# Patient Record
Sex: Male | Born: 1950 | Race: White | Hispanic: No | Marital: Single | Smoking: Never smoker
Health system: Southern US, Community
[De-identification: ages and names within clinical notes are randomized; demographics above are authoritative.]

## PROBLEM LIST (undated history)

## (undated) DIAGNOSIS — I4891 Unspecified atrial fibrillation: Secondary | ICD-10-CM

## (undated) DIAGNOSIS — M1990 Unspecified osteoarthritis, unspecified site: Secondary | ICD-10-CM

## (undated) DIAGNOSIS — I10 Essential (primary) hypertension: Secondary | ICD-10-CM

## (undated) DIAGNOSIS — I359 Nonrheumatic aortic valve disorder, unspecified: Secondary | ICD-10-CM

## (undated) HISTORY — PX: AORTIC VALVE REPLACEMENT: SHX41

---

## 2011-07-24 ENCOUNTER — Other Ambulatory Visit: Payer: Self-pay

## 2011-07-24 ENCOUNTER — Emergency Department (HOSPITAL_COMMUNITY)
Admission: EM | Admit: 2011-07-24 | Discharge: 2011-07-24 | Disposition: A | Discharge status: Home / Self Care | Payer: Self-pay | Attending: Emergency Medicine | Admitting: Emergency Medicine

## 2011-07-24 DIAGNOSIS — T50901A Poisoning by unspecified drugs, medicaments and biological substances, accidental (unintentional), initial encounter: Secondary | ICD-10-CM

## 2011-07-24 DIAGNOSIS — T6591XA Toxic effect of unspecified substance, accidental (unintentional), initial encounter: Secondary | ICD-10-CM | POA: Insufficient documentation

## 2011-07-24 HISTORY — DX: Unspecified osteoarthritis, unspecified site: M19.90

## 2011-07-24 HISTORY — DX: Nonrheumatic aortic valve disorder, unspecified: I35.9

## 2011-07-24 HISTORY — DX: Essential (primary) hypertension: I10

## 2011-07-24 NOTE — ED Provider Notes (Addendum)
History     CSN: [id number] Arrival date & time: 07/24/2011  8:26 AM   First MD Initiated Contact with Patient 07/24/11 0840      Chief Complaint  Patient presents with  . Drug Overdose    (Consider location/radiation/quality/duration/timing/severity/associated sxs/prior treatment) Patient is a 60 y.o. male presenting with Overdose.  Drug Overdose This is a new problem. The current episode started 1 to 2 hours ago. The problem has not changed since onset.Pertinent negatives include no chest pain, no headaches and no shortness of breath. The symptoms are aggravated by nothing. The symptoms are relieved by nothing. He has tried nothing for the symptoms.   At home 730 hours this morning, the patient realizesdthat he may have taken his usual morning medications twice. He uses a daily medication container set up each week. His Wednesday and Thursday morning medications were missing from the container this morning. He cannot recall taking them twice but thinks that he may have. He is completely asymptomatic at this time. He has not eaten breakfast yet.  Past Medical History  Diagnosis Date  . Hypertension   . Aortic valve disease   . Arthritis     Past Surgical History  Procedure Date  . Aortic valve replacement     History reviewed. No pertinent family history.  History  Substance Use Topics  . Smoking status: Never Smoker   . Smokeless tobacco: Never Used  . Alcohol Use: No      Review of Systems  Respiratory: Negative for shortness of breath.   Cardiovascular: Negative for chest pain.  Neurological: Negative for headaches.  All other systems reviewed and are negative.    Allergies  Review of patient's allergies indicates no known allergies.  Home Medications   Current Outpatient Rx  Name Route Sig Dispense Refill  . ACETAMINOPHEN 500 MG PO TABS Oral Take 1,000 mg by mouth every 6 (six) hours as needed. For pain     . ASPIRIN EC 81 MG PO TBEC Oral Take 81 mg by  mouth daily. May have taken twice this am...07/24/11     . HYDROCHLOROTHIAZIDE 12.5 MG PO CAPS Oral Take 12.5 mg by mouth daily.      Marland Kitchen LISINOPRIL 20 MG PO TABS Oral Take 20 mg by mouth daily. May have taken twice today 07/24/11     . POTASSIUM CHLORIDE 20 MEQ PO PACK Oral Take 20 mEq by mouth 2 (two) times daily.      Marland Kitchen SOTALOL HCL 80 MG PO TABS Oral Take 160 mg by mouth 2 (two) times daily. May have taken twice this am 07/24/11 at 7:20am     . TETRAHYDROZOLINE HCL 0.05 % OP SOLN Both Eyes Place 1 drop into both eyes 2 (two) times daily. For allergy eyes     . WARFARIN SODIUM 2.5 MG PO TABS Oral Take 2.5 mg by mouth daily. Takes tues, thurs., sat.     . WARFARIN SODIUM 5 MG PO TABS Oral Take 5 mg by mouth daily. 07/24/11 may have taken warfarin twice this am..pt. Not sure.  Takes 82m mon., wed., fri., sun.       BP 153/101  Pulse 60  Temp(Src) 97.4 F (36.3 C) (Oral)  Resp 20  SpO2 100%  Physical Exam  Constitutional: He is oriented to person, place, and time. He appears well-developed and well-nourished.  HENT:  Head: Normocephalic and atraumatic.  Eyes: Conjunctivae are normal. Pupils are equal, round, and reactive to light.  Neck: Normal range of motion.  Neck supple.  Cardiovascular: Normal rate.   Pulmonary/Chest: Effort normal and breath sounds normal.  Musculoskeletal: Normal range of motion. He exhibits no edema and no tenderness.  Neurological: He is alert and oriented to person, place, and time. No cranial nerve deficit. He exhibits normal muscle tone. Coordination normal.  Skin: Skin is warm and dry.  Psychiatric: He has a normal mood and affect. His behavior is normal. Judgment and thought content normal.    ED Course  Procedures (including critical care time)  Date: 07/24/2011  Rate: 64  Rhythm: normal sinus rhythm  QRS Axis: normal  Intervals: normal  ST/T Wave abnormalities: normal  Conduction Disutrbances:none  Narrative Interpretation: PVC present  Old EKG  Reviewed: none available      9:54 AM Repeat blood pressure 140/90. Patient alert, cooperative, comfortable at this time. He has spoken with his cardiologist about the events, this morning.     Labs Reviewed - No data to display No results found.   No diagnosis found.    MDM  Medication misadventure, without toxicity. Patient is stable for discharge. He can resume his normal treatments tomorrow.        Flint Melter, MD 07/24/11 1610  Flint Melter, MD 07/24/11 [phone]

## 2011-07-24 NOTE — ED Notes (Signed)
Pt presents from home this morning. He states that while he was getting ready this morning he took his regular medications which included blood pressure medications and coumadin. He states that he thinks that he may have taken 2 doses of lisinopril, metoprolol, and coumadin. Pt has no complaints. Alert and oriented. This was in no way a suicidal attempt. Family at the bedside.

## 2011-07-24 NOTE — ED Notes (Signed)
While patient was getting ready this morning for work he was rushing and may have taken an extra dose of blood pressure medication and blood thinner. He denies any suicidal ideation or attempt. No distress. Breath sounds are clear and bowel sounds are present.

## 2011-07-24 NOTE — ED Notes (Signed)
Effie Shy, MD at bedside speaking with family about d/c home

## 2019-07-20 DIAGNOSIS — Z01818 Encounter for other preprocedural examination: Secondary | ICD-10-CM

## 2019-08-29 ENCOUNTER — Emergency Department (HOSPITAL_BASED_OUTPATIENT_CLINIC_OR_DEPARTMENT_OTHER): Payer: Medicaid Other

## 2019-08-29 ENCOUNTER — Encounter (HOSPITAL_BASED_OUTPATIENT_CLINIC_OR_DEPARTMENT_OTHER): Payer: Self-pay | Admitting: Emergency Medicine

## 2019-08-29 ENCOUNTER — Emergency Department (HOSPITAL_BASED_OUTPATIENT_CLINIC_OR_DEPARTMENT_OTHER)
Admission: EM | Admit: 2019-08-29 | Discharge: 2019-08-29 | Disposition: A | Discharge status: Home / Self Care | Payer: Medicaid Other | Attending: Emergency Medicine | Admitting: Emergency Medicine

## 2019-08-29 ENCOUNTER — Other Ambulatory Visit: Payer: Self-pay

## 2019-08-29 DIAGNOSIS — I50810 Right heart failure, unspecified: Secondary | ICD-10-CM

## 2019-08-29 DIAGNOSIS — I10 Essential (primary) hypertension: Secondary | ICD-10-CM | POA: Diagnosis not present

## 2019-08-29 DIAGNOSIS — I4891 Unspecified atrial fibrillation: Secondary | ICD-10-CM | POA: Insufficient documentation

## 2019-08-29 DIAGNOSIS — R0602 Shortness of breath: Secondary | ICD-10-CM | POA: Diagnosis present

## 2019-08-29 DIAGNOSIS — Z7982 Long term (current) use of aspirin: Secondary | ICD-10-CM | POA: Insufficient documentation

## 2019-08-29 DIAGNOSIS — Z20828 Contact with and (suspected) exposure to other viral communicable diseases: Secondary | ICD-10-CM | POA: Insufficient documentation

## 2019-08-29 DIAGNOSIS — Z7901 Long term (current) use of anticoagulants: Secondary | ICD-10-CM | POA: Diagnosis not present

## 2019-08-29 DIAGNOSIS — Z79899 Other long term (current) drug therapy: Secondary | ICD-10-CM | POA: Insufficient documentation

## 2019-08-29 HISTORY — DX: Unspecified atrial fibrillation: I48.91

## 2019-08-29 LAB — CBC WITH DIFFERENTIAL/PLATELET
Abs Immature Granulocytes: 0.12 10*3/uL — ABNORMAL HIGH (ref 0.00–0.07)
Basophils Absolute: 0 10*3/uL (ref 0.0–0.1)
Basophils Relative: 0 %
Eosinophils Absolute: 0 10*3/uL (ref 0.0–0.5)
Eosinophils Relative: 0 %
HCT: 31.8 % — ABNORMAL LOW (ref 39.0–52.0)
Hemoglobin: 9.6 g/dL — ABNORMAL LOW (ref 13.0–17.0)
Immature Granulocytes: 1 %
Lymphocytes Relative: 3 %
Lymphs Abs: 0.3 10*3/uL — ABNORMAL LOW (ref 0.7–4.0)
MCH: 25.5 pg — ABNORMAL LOW (ref 26.0–34.0)
MCHC: 30.2 g/dL (ref 30.0–36.0)
MCV: 84.4 fL (ref 80.0–100.0)
Monocytes Absolute: 0.6 10*3/uL (ref 0.1–1.0)
Monocytes Relative: 6 %
Neutro Abs: 8.8 10*3/uL — ABNORMAL HIGH (ref 1.7–7.7)
Neutrophils Relative %: 90 %
Platelets: 400 10*3/uL (ref 150–400)
RBC: 3.77 MIL/uL — ABNORMAL LOW (ref 4.22–5.81)
RDW: 19.6 % — ABNORMAL HIGH (ref 11.5–15.5)
WBC: 9.9 10*3/uL (ref 4.0–10.5)
nRBC: 0 % (ref 0.0–0.2)

## 2019-08-29 LAB — COMPREHENSIVE METABOLIC PANEL
ALT: 19 U/L (ref 0–44)
AST: 20 U/L (ref 15–41)
Albumin: 3.6 g/dL (ref 3.5–5.0)
Alkaline Phosphatase: 56 U/L (ref 38–126)
Anion gap: 10 (ref 5–15)
BUN: 13 mg/dL (ref 8–23)
CO2: 25 mmol/L (ref 22–32)
Calcium: 9 mg/dL (ref 8.9–10.3)
Chloride: 100 mmol/L (ref 98–111)
Creatinine, Ser: 0.71 mg/dL (ref 0.61–1.24)
GFR calc Af Amer: >60 mL/min (ref >60)
GFR calc non Af Amer: >60 mL/min (ref >60)
Glucose, Bld: 107 mg/dL — ABNORMAL HIGH (ref 70–99)
Potassium: 3.4 mmol/L — ABNORMAL LOW (ref 3.5–5.1)
Sodium: 135 mmol/L (ref 135–145)
Total Bilirubin: 1.5 mg/dL — ABNORMAL HIGH (ref 0.3–1.2)
Total Protein: 6.9 g/dL (ref 6.5–8.1)

## 2019-08-29 LAB — SARS CORONAVIRUS 2 AG (30 MIN TAT): SARS Coronavirus 2 Ag: NEGATIVE

## 2019-08-29 LAB — TROPONIN I (HIGH SENSITIVITY)
Troponin I (High Sensitivity): 20 ng/L — ABNORMAL HIGH (ref <18)
Troponin I (High Sensitivity): 18 ng/L — ABNORMAL HIGH (ref <18)

## 2019-08-29 LAB — BRAIN NATRIURETIC PEPTIDE: B Natriuretic Peptide: 389.7 pg/mL — ABNORMAL HIGH (ref 0.0–100.0)

## 2019-08-29 MED ORDER — MORPHINE SULFATE (PF) 4 MG/ML IV SOLN
4.0000 mg | Freq: Once | INTRAVENOUS | Status: AC
  Administered 2019-08-29: 4 mg via INTRAVENOUS
  Filled 2019-08-29: qty 1

## 2019-08-29 MED ORDER — IOHEXOL 350 MG/ML SOLN
100.0000 mL | Freq: Once | INTRAVENOUS | Status: AC | PRN
  Administered 2019-08-29: 94 mL via INTRAVENOUS

## 2019-08-29 MED ORDER — ONDANSETRON HCL 4 MG/2ML IJ SOLN
4.0000 mg | Freq: Once | INTRAMUSCULAR | Status: AC
  Administered 2019-08-29: 14:00:00 4 mg via INTRAVENOUS
  Filled 2019-08-29: qty 2

## 2019-08-29 NOTE — ED Notes (Signed)
ED Provider at bedside. 

## 2019-08-29 NOTE — ED Notes (Signed)
Pt given urinal.

## 2019-08-29 NOTE — ED Notes (Signed)
Spoke with Maggie in lab to add on BNP 

## 2019-08-29 NOTE — ED Provider Notes (Signed)
[facility] EMERGENCY DEPARTMENT Provider Note   CSN: [id number] Arrival date & time: 08/29/19  1058     History Chief Complaint  Patient presents with   Shortness of Breath    Craig Kirk is a 68 y.o. male.  68 yo M with a chief complaints of shortness of breath.  Has been going on for a few days.  He is also had nausea and loss of smell.  He had his left knee replaced 12 days ago.  Has had some swelling and pain to the area.  Was seen a couple days ago and had an ultrasound that was negative for DVT.  He is concerned that maybe he has an infection to the knee.  Denies any purulent drainage to the area.  Had some mild bleeding and changed his dressing at home.  He denies chest pain or pressure.  Denies cough or congestion.  No fevers.  No abdominal pain.  The history is provided by the patient.  Shortness of Breath Severity:  Moderate Onset quality:  Gradual Duration:  2 days Timing:  Constant Progression:  Unchanged Chronicity:  New Relieved by:  Nothing Worsened by:  Nothing Ineffective treatments:  None tried Associated symptoms: no abdominal pain, no chest pain, no fever, no headaches, no rash and no vomiting        Past Medical History:  Diagnosis Date   Aortic valve disease    Arthritis    Atrial fibrillation (HCC)    Hypertension     There are no problems to display for this patient.   Past Surgical History:  Procedure Laterality Date   AORTIC VALVE REPLACEMENT         No family history on file.  Social History   Tobacco Use   Smoking status: Never Smoker   Smokeless tobacco: Never Used  Substance Use Topics   Alcohol use: No   Drug use: No    Home Medications Prior to Admission medications   Medication Sig Start Date End Date Taking? Authorizing Provider  acetaminophen (TYLENOL) 500 MG tablet Take 1,000 mg by mouth every 6 (six) hours as needed. For pain     [provider]  aspirin EC 81 MG tablet Take 81 mg  by mouth daily. May have taken twice this am...07/24/11     [provider]  diltiazem (CARDIZEM CD) 180 MG 24 hr capsule Take 180 mg by mouth daily. 05/26/19   [provider]  hydrochlorothiazide (MICROZIDE) 12.5 MG capsule Take 12.5 mg by mouth daily.      [provider]  lisinopril (PRINIVIL,ZESTRIL) 20 MG tablet Take 20 mg by mouth daily. May have taken twice today 07/24/11     [provider]  potassium chloride (KLOR-CON) 20 MEQ packet Take 20 mEq by mouth 2 (two) times daily.      [provider]  pravastatin (PRAVACHOL) 40 MG tablet Take 40 mg by mouth daily. 08/06/19   [provider]  rizatriptan (MAXALT) 10 MG tablet Take 10 mg by mouth 2 (two) times daily as needed. 08/16/19   [provider]  sotalol (BETAPACE) 80 MG tablet Take 160 mg by mouth 2 (two) times daily. May have taken twice this am 07/24/11 at 7:20am     [provider]  tetrahydrozoline 0.05 % ophthalmic solution Place 1 drop into both eyes 2 (two) times daily. For allergy eyes     [provider]  warfarin (COUMADIN) 2.5 MG tablet Take 2.5 mg by mouth  daily. Takes tues, thurs., sat.     [provider]  warfarin (COUMADIN) 5 MG tablet Take 5 mg by mouth daily. 07/24/11 may have taken warfarin twice this am..pt. Not sure.  Takes 36m mon., wed., fri., sun.     [provider]    Allergies    Patient has no known allergies.  Review of Systems   Review of Systems  Constitutional: Negative for chills and fever.  HENT: Negative for congestion and facial swelling.   Eyes: Negative for discharge and visual disturbance.  Respiratory: Positive for shortness of breath.   Cardiovascular: Positive for leg swelling. Negative for chest pain and palpitations.  Gastrointestinal: Positive for nausea. Negative for abdominal pain, diarrhea and vomiting.  Musculoskeletal: Negative for arthralgias and myalgias.  Skin: Negative for color  change and rash.  Neurological: Negative for tremors, syncope and headaches.  Psychiatric/Behavioral: Negative for confusion and dysphoric mood.    Physical Exam Updated Vital Signs BP (!) 164/87 (BP Location: Left Arm)    Pulse 80    Temp 98.4 F (36.9 C) (Oral)    Resp 18    Ht  (1.803 m)    Wt 90.7 kg    SpO2 99%    BMI 27.89 kg/m   Physical Exam Vitals and nursing note reviewed.  Constitutional:      Appearance: He is well-developed.  HENT:     Head: Normocephalic and atraumatic.  Eyes:     Pupils: Pupils are equal, round, and reactive to light.  Neck:     Vascular: No JVD.  Cardiovascular:     Rate and Rhythm: Normal rate and regular rhythm.     Heart sounds: No murmur. No friction rub. No gallop.   Pulmonary:     Effort: No respiratory distress.     Breath sounds: No decreased breath sounds or wheezing.  Abdominal:     General: There is no distension.     Tenderness: There is no guarding or rebound.  Musculoskeletal:        General: Normal range of motion.     Cervical back: Normal range of motion and neck supple.     Left lower leg: Edema present.     Comments: Left leg edema.  No erythema.  Mildly more warm than the other side.  Pulse motor and sensation intact distally.  Skin:    Coloration: Skin is not pale.     Findings: No rash.  Neurological:     Mental Status: He is alert and oriented to person, place, and time.  Psychiatric:        Behavior: Behavior normal.     ED Results / Procedures / Treatments   Labs (all labs ordered are listed, but only abnormal results are displayed) Labs Reviewed  CBC WITH DIFFERENTIAL/PLATELET - Abnormal; Notable for the following components:      Result Value   RBC 3.77 (*)    Hemoglobin 9.6 (*)    HCT 31.8 (*)    MCH 25.5 (*)    RDW 19.6 (*)    Neutro Abs 8.8 (*)    Lymphs Abs 0.3 (*)    Abs Immature Granulocytes 0.12 (*)    All other components within normal limits  COMPREHENSIVE METABOLIC PANEL - Abnormal;  Notable for the following components:   Potassium 3.4 (*)    Glucose, Bld 107 (*)    Total Bilirubin 1.5 (*)    All other components within normal limits  BRAIN NATRIURETIC PEPTIDE -  Abnormal; Notable for the following components:   B Natriuretic Peptide 389.7 (*)    All other components within normal limits  TROPONIN I (HIGH SENSITIVITY) - Abnormal; Notable for the following components:   Troponin I (High Sensitivity) 20 (*)    All other components within normal limits  TROPONIN I (HIGH SENSITIVITY) - Abnormal; Notable for the following components:   Troponin I (High Sensitivity) 18 (*)    All other components within normal limits  SARS CORONAVIRUS 2 AG (30 MIN TAT)  CULTURE, BLOOD (ROUTINE X 2)  CULTURE, BLOOD (ROUTINE X 2)  NOVEL CORONAVIRUS, NAA (HOSP ORDER, SEND-OUT TO REF LAB; TAT 18-24 HRS)    EKG EKG Interpretation  Date/Time:  Sunday August 29 2019 11:18:18 EST Ventricular Rate:  83 PR Interval:    QRS Duration: 145 QT Interval:  417 QTC Calculation: 490 R Axis:   -16 Text Interpretation: sinus arrythmia IVCD, consider atypical LBBB No significant change since last tracing Confirmed by Melene Plan [phone]) on 08/29/2019 11:35:10 AM   Radiology CT Angio Chest PE W and/or Wo Contrast  Result Date: 08/29/2019 CLINICAL DATA:  68 year old with acute onset of shortness of breath upon exertion and anosmia. Negative rapid COVID-19 test today. LEFT total knee arthroplasty 08/17/2019. Surgical history includes aortic valve replacement. EXAM: CT ANGIOGRAPHY CHEST WITH CONTRAST TECHNIQUE: Multidetector CT imaging of the chest was performed using the standard protocol during bolus administration of intravenous contrast. Multiplanar CT image reconstructions and MIPs were obtained to evaluate the vascular anatomy. CONTRAST:  63mL OMNIPAQUE IOHEXOL 350 MG/ML IV. COMPARISON:  None. FINDINGS: Cardiovascular: Contrast opacification of pulmonary arteries is very good. No filling defects  within either main pulmonary artery or their segmental branches in either lung to suggest pulmonary embolism. Heart moderately enlarged with LEFT ventricular enlargement and LEFT atrial enlargement. Prior aortic valve replacement. Moderate LAD and LEFT circumflex coronary atherosclerosis. Reflux of contrast into the intrahepatic IVC and the hepatic veins. Ascending thoracic aortic aneurysm measuring up to 5.0 cm diameter. Moderate atherosclerosis involving the thoracic and proximal abdominal aorta. Atherosclerosis involving the proximal great vessels without evidence of significant stenosis. Bovine aortic arch anatomy (LEFT common carotid artery arises from the innominate artery). Mediastinum/Nodes: Numerous upper normal sized mediastinal and BILATERAL hilar lymph nodes, the largest in the RIGHT hilum measuring approximately 2.4 x 1.8 cm. No nodal masses. Normal appearing esophagus. Visualized thyroid gland normal in appearance. Lungs/Pleura: Ground-glass opacities throughout both lungs with geographic areas of hyperlucency scattered throughout both lungs. Scattered blebs or air cysts in both lungs. No confluent airspace consolidation. Prominent subpleural fat involving both posteroinferior hemithoraces and involving the LEFT major fissure. No pleural effusions. Central airways patent with moderate central peribronchial thickening. Upper Abdomen: Visualized upper abdomen unremarkable for the early arterial phase of enhancement. Musculoskeletal: Degenerative disc disease and spondylosis involving the visualized lower cervical spine and throughout the thoracic spine. Prior sternotomy. No acute findings. Review of the MIP images confirms the above findings. IMPRESSION: 1. No evidence of pulmonary embolism. 2. Cardiomegaly with LEFT ventricular enlargement and LEFT atrial enlargement. Reflux of contrast into the intrahepatic IVC and the hepatic veins may indicate right heart failure and/or tricuspid regurgitation. 3.  Ground-glass opacities throughout both lungs with geographic areas of hyperlucency scattered throughout both lungs, likely indicating indicating small airways disease (asthma and/or bronchitis). However, ground-glass opacities in both lungs is a commonly reported imaging feature of COVID-19 pneumonia. Other processes such as influenza pneumonia and organizing pneumonia, as can be seen with drug toxicity and connective  tissue disease, can cause a similar imaging pattern. 4. Numerous upper normal-sized mediastinal and BILATERAL hilar lymph nodes, likely reactive. 5. Ascending thoracic aortic aneurysm measuring up to 5.0 cm diameter. Recommend semi-annual imaging followup by CTA or MRA and referral to cardiothoracic surgery if not already obtained. This recommendation follows 2010 ACCF/AHA/AATS/ACR/ASA/SCA/SCAI/SIR/STS/SVM Guidelines for the Diagnosis and Management of Patients With Thoracic Aortic Disease. Circulation. 2010; 121: P295-J884. Aortic aneurysm NOS (ICD10-I71.9) Aortic Atherosclerosis (ICD10-I70.0). Emphysema (ICD10-J43.9). Electronically Signed   By: Evangeline Dakin M.D.   On: 08/29/2019 14:25   DG Chest Port 1 View  Result Date: 08/29/2019 CLINICAL DATA:  Shortness of breath. Loss of smell. EXAM: PORTABLE CHEST 1 VIEW COMPARISON:  July 20, 2019 FINDINGS: The mediastinal contour is stable. The heart size is normal. Mild increased pulmonary interstitium is identified bilaterally. There is probable minimal left pleural effusion. Bony structures are stable. IMPRESSION: Mild increased pulmonary interstitium bilaterally. This can be seen in interstitial edema or viral infection. There is probable minimal left pleural effusion. Electronically Signed   By: Abelardo Diesel M.D.   On: 08/29/2019 11:58    Procedures Procedures (including critical care time)  Medications Ordered in ED Medications  morphine 4 MG/ML injection 4 mg (4 mg Intravenous Given 08/29/19 1410)  ondansetron (ZOFRAN) injection  4 mg (4 mg Intravenous Given 08/29/19 1410)  iohexol (OMNIPAQUE) 350 MG/ML injection 100 mL (94 mLs Intravenous Contrast Given 08/29/19 1359)    ED Course  I have reviewed the triage vital signs and the nursing notes.  Pertinent labs & imaging results that were available during my care of the patient were reviewed by me and considered in my medical decision making (see chart for details).    MDM Rules/Calculators/A&P                      68 yo M just less than 2 weeks postop from a left knee replacement comes in with a chief complaint of shortness of breath and nausea.  Patient symptoms could represents a pulmonary embolism though seems less likely that this would cause him to lose his sense of smell.  This is also occurring during the novel coronavirus pandemic.  We will send off a rapid Covid test.  This could explain all of his symptoms.  With his knee surgery of that with him at higher risk for a pulmonary embolism, I feel this would be less likely to cause a decreased sense of smell though could be the source of his leg edema and shortness of breath.  He did have a DVT study done 48 hours ago that reportedly was negative.  Patient's troponin is very mildly positive.  This makes me more concerned about a pulmonary embolism.  Will obtain a CT angiogram of the chest.  We will repeat troponin.  I verified with the patient and he does not have any chest pain or pressure.  Planing of some mild knee pain currently.  CT scans negative for pulmonary embolism.  The patient does have signs of right heart failure on that CT. I discussed this with the patient, he states that is a known problem for him.  He is followed up with a cardiologist for this as an outpatient.  He thinks is that likely is a chronic finding.  Awaiting his second troponin.  Second trop is essentially unchanged from the first.  Will discharge patient home.  Have him follow-up with his orthopedic surgeon as well as his family doctor.   Return  for worsening symptoms.  3:11 PM:  I have discussed the diagnosis/risks/treatment options with the patient and believe the pt to be eligible for discharge home to follow-up with PCP. We also discussed returning to the ED immediately if new or worsening sx occur. We discussed the sx which are most concerning (e.g., sudden worsening pain, fever, inability to tolerate by mouth) that necessitate immediate return. Medications administered to the patient during their visit and any new prescriptions provided to the patient are listed below.  Medications given during this visit Medications  morphine 4 MG/ML injection 4 mg (4 mg Intravenous Given 08/29/19 1410)  ondansetron (ZOFRAN) injection 4 mg (4 mg Intravenous Given 08/29/19 1410)  iohexol (OMNIPAQUE) 350 MG/ML injection 100 mL (94 mLs Intravenous Contrast Given 08/29/19 1359)     The patient appears reasonably screen and/or stabilized for discharge and I doubt any other medical condition or other [facility] requiring further screening, evaluation, or treatment in the ED at this time prior to discharge.   Final Clinical Impression(s) / ED Diagnoses Final diagnoses:  SOB (shortness of breath) on exertion  Right heart failure, unspecified [facility])    Rx / DC Orders ED Discharge Orders    None       Melene Plan, DO 08/29/19 1511

## 2019-08-29 NOTE — ED Notes (Signed)
Patient transported to CT 

## 2019-08-29 NOTE — ED Triage Notes (Signed)
SOB with exertion today with loss of smell. Denies chest pain

## 2019-08-31 LAB — NOVEL CORONAVIRUS, NAA (HOSP ORDER, SEND-OUT TO REF LAB; TAT 18-24 HRS): SARS-CoV-2, NAA: NOT DETECTED

## 2019-09-03 LAB — CULTURE, BLOOD (ROUTINE X 2)
Culture: NO GROWTH
Culture: NO GROWTH
Special Requests: ADEQUATE
Special Requests: ADEQUATE

## 2020-06-13 IMAGING — DX DG CHEST 1V PORT
1 series · 1 of 1 positions shown · non-contrast
Comparison: July 20, 2019

CLINICAL DATA: Shortness of breath. Loss of smell.

EXAM:
PORTABLE CHEST 1 VIEW

[chest ap]
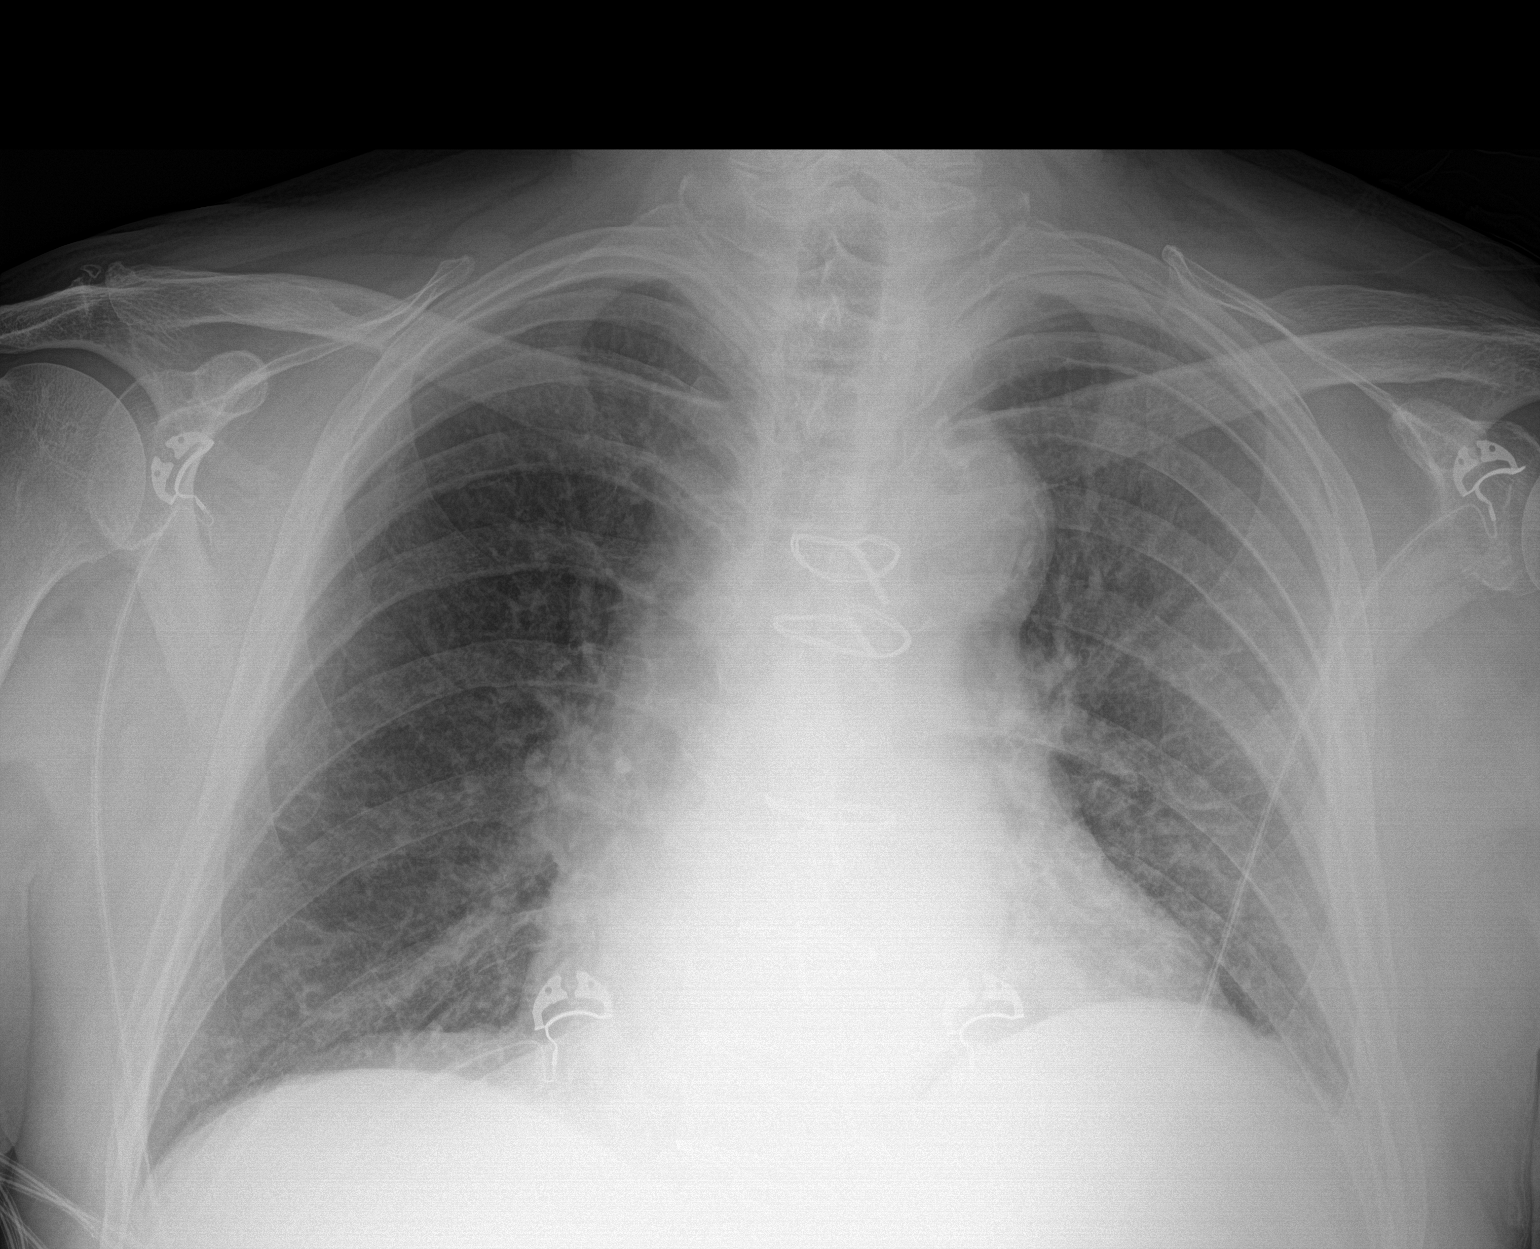

[1 of 1 positions shown; findings below may reference images not displayed]

FINDINGS: The mediastinal contour is stable. The heart size is normal. Mild
increased pulmonary interstitium is identified bilaterally. There is
probable minimal left pleural effusion. Bony structures are stable.
IMPRESSION: Mild increased pulmonary interstitium bilaterally. This can be seen
in interstitial edema or viral infection. There is probable minimal
left pleural effusion.

## 2020-06-13 IMAGING — CT CT ANGIO CHEST
3 of 9 series · 17 of 36 positions shown · IV contrast (Omnipaque)
Comparison: None.

CLINICAL DATA: 68-year-old with acute onset of shortness of breath
upon exertion and anosmia. Negative rapid 5Y1BZ-7E test today. LEFT
total knee arthroplasty 08/17/2019. Surgical history includes aortic
valve replacement.

EXAM:
CT ANGIOGRAPHY CHEST WITH CONTRAST
TECHNIQUE: Multidetector CT imaging of the chest was performed using the
standard protocol during bolus administration of intravenous
contrast. Multiplanar CT image reconstructions and MIPs were
obtained to evaluate the vascular anatomy.
CONTRAST:  94mL OMNIPAQUE IOHEXOL 350 MG/ML IV.

[Series 5: pe thins · axial · 0.85mm/px · z∈[-329,-68]mm · 14 of 303 slices shown]
[im 21/303  lung]
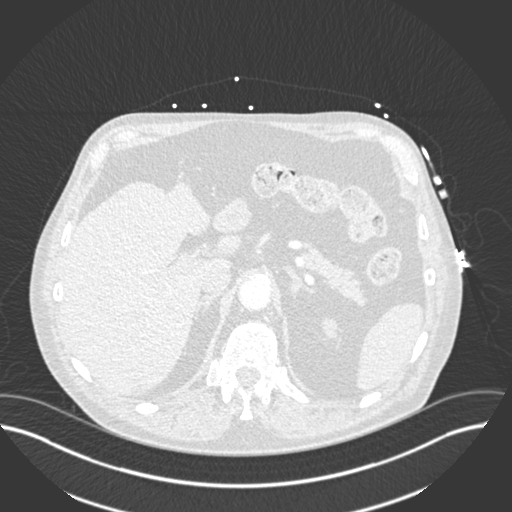
[im 41/303  mediastinal]
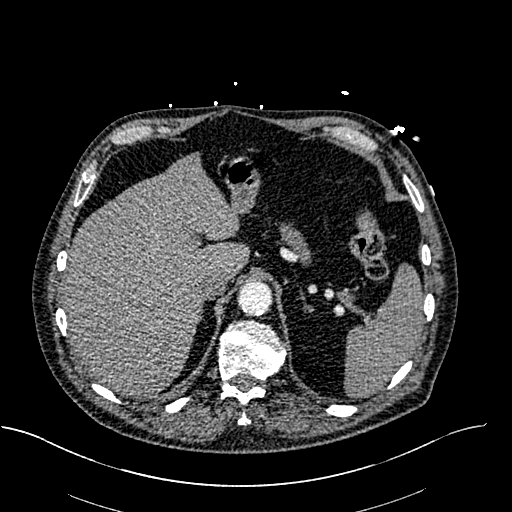
[im 61/303  lung]
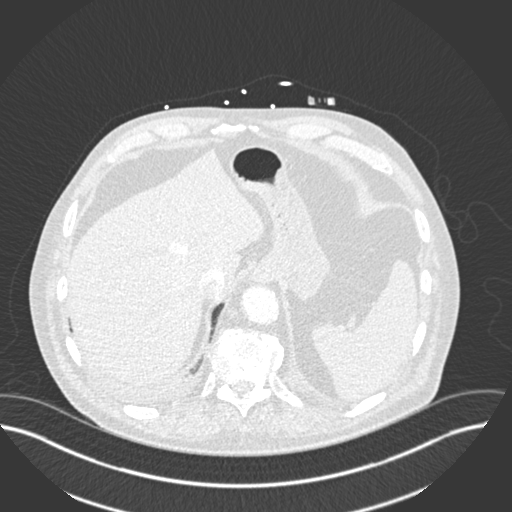
[im 81/303  mediastinal]
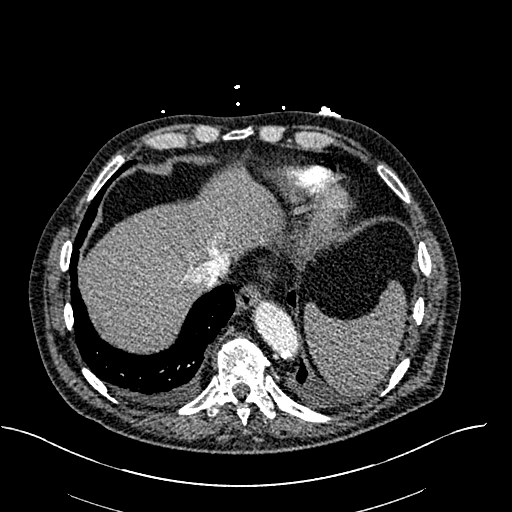
[im 101/303  lung]
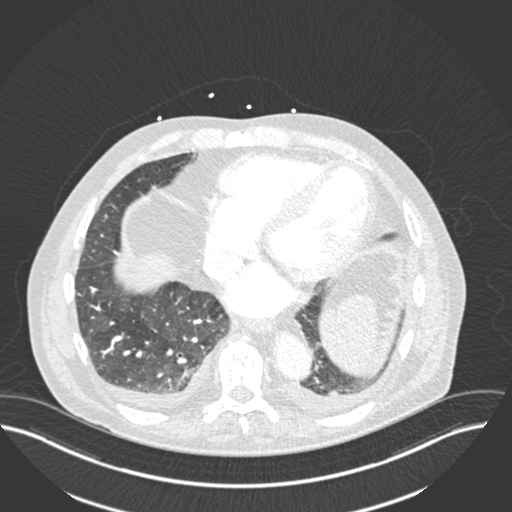
[im 121/303  mediastinal]
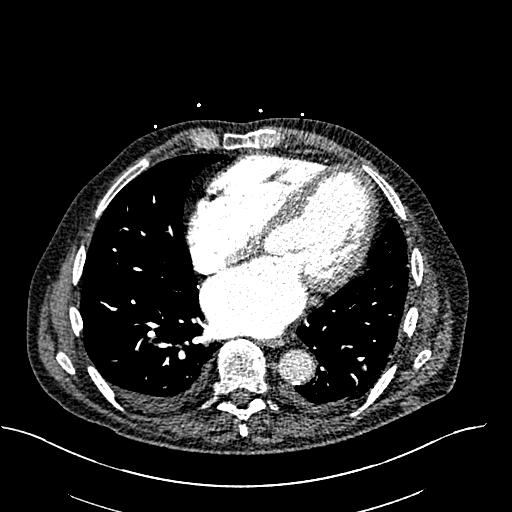
[im 141/303  lung]
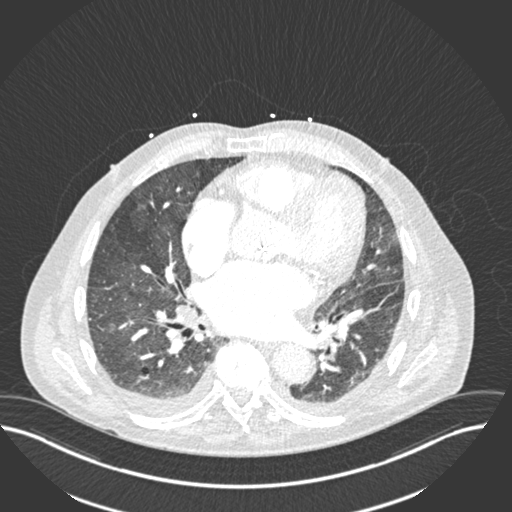
[im 162/303  mediastinal]
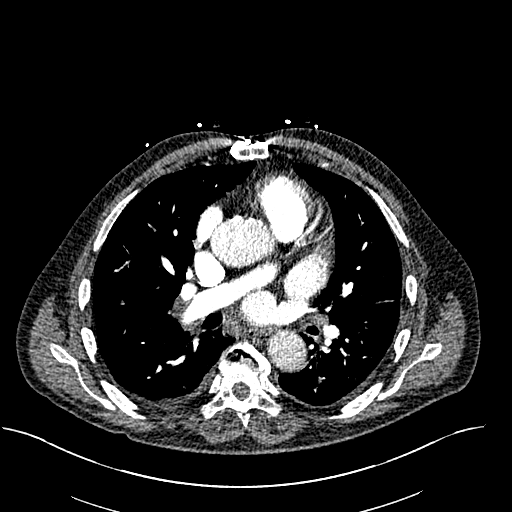
[im 182/303  lung]
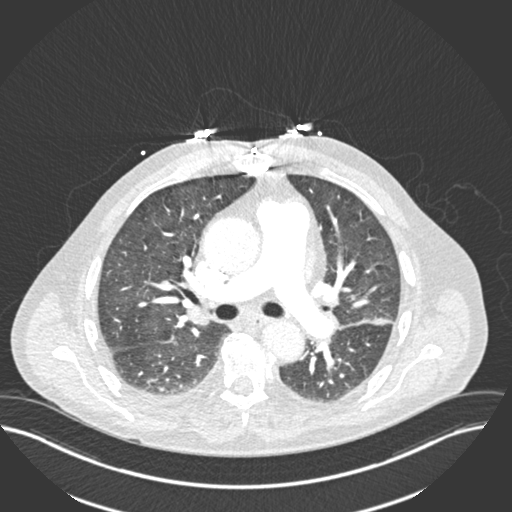
[im 202/303  mediastinal]
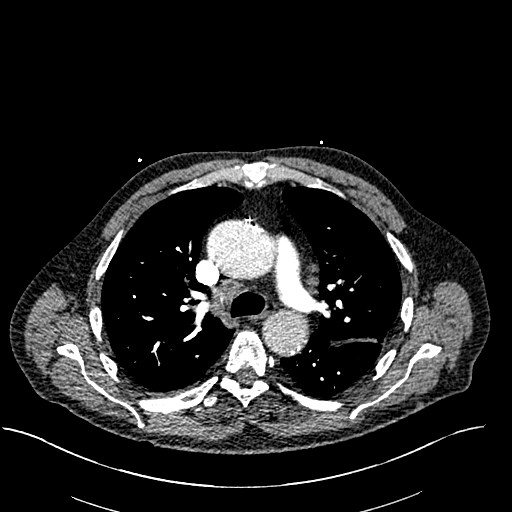
[im 222/303  lung]
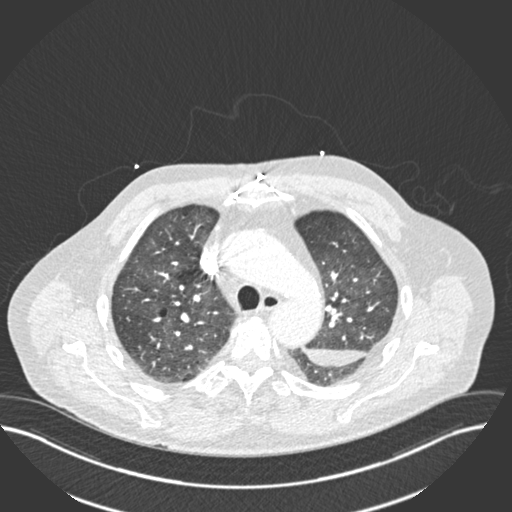
[im 242/303  mediastinal]
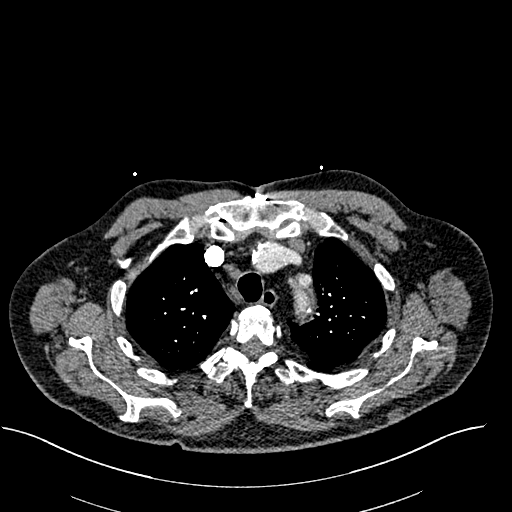
[im 262/303  lung]
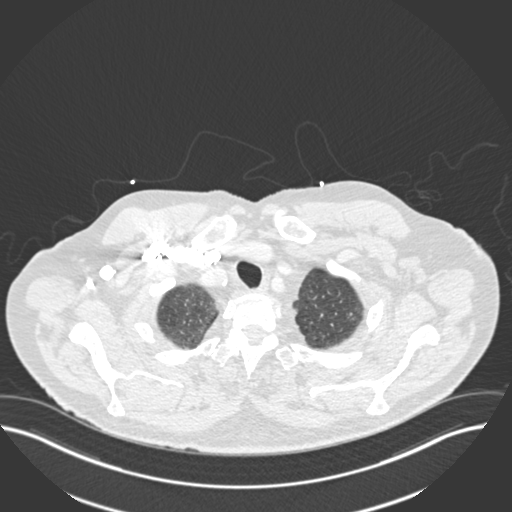
[im 282/303  mediastinal]
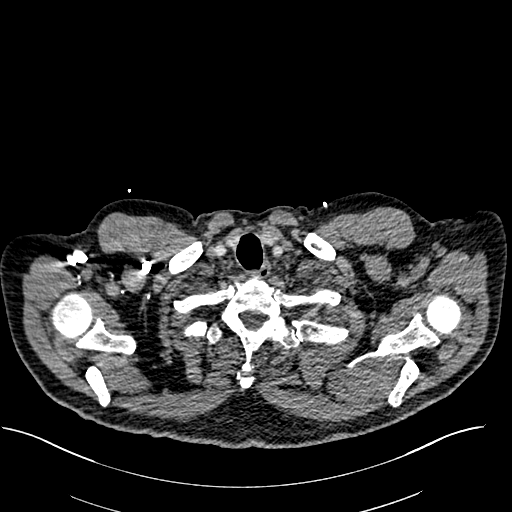

[Series 6: pe lung · axial · 0.94mm/px · z∈[-233,-170]mm · 2 of 83 slices shown]
[im 21/83  mediastinal]
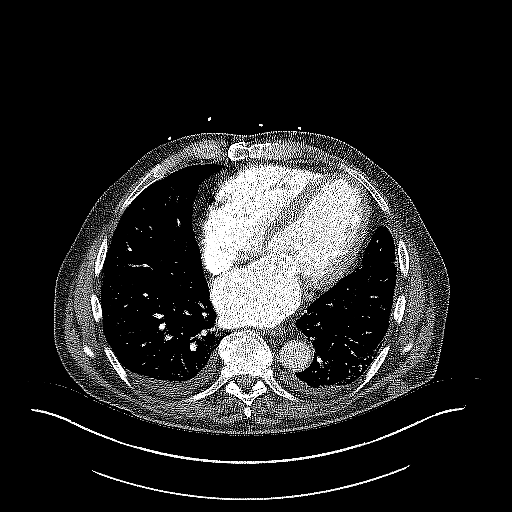
[im 42/83  mediastinal]
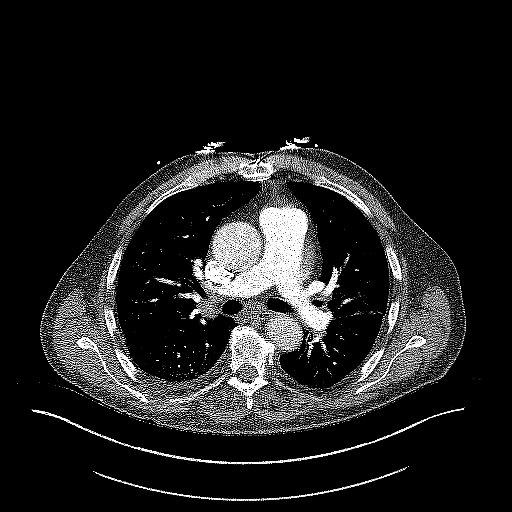

[Series 7: pe coronal mpr · coronal · 0.60mm/px · 1 of 151 slices shown]
[im 76/151  mediastinal]
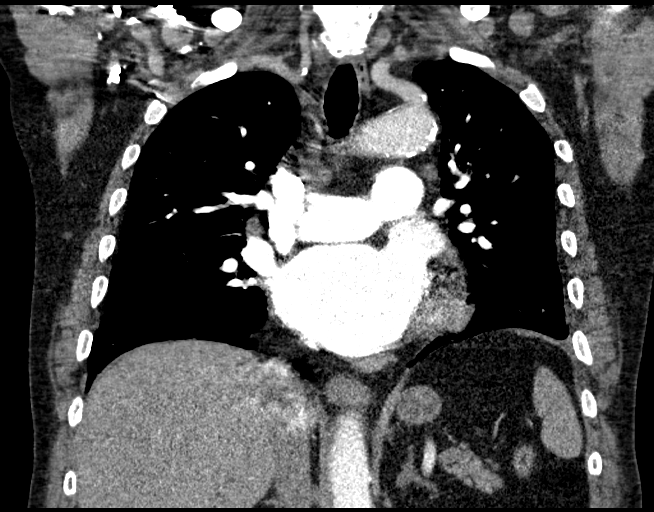

[17 of 36 positions shown; findings below may reference images not displayed]

FINDINGS: Cardiovascular: Contrast opacification of pulmonary arteries is very
good. No filling defects within either main pulmonary artery or
their segmental branches in either lung to suggest pulmonary
embolism.

Heart moderately enlarged with LEFT ventricular enlargement and LEFT
atrial enlargement. Prior aortic valve replacement. Moderate LAD and
LEFT circumflex coronary atherosclerosis. Reflux of contrast into
the intrahepatic IVC and the hepatic veins.

Ascending thoracic aortic aneurysm measuring up to 5.0 cm diameter.
Moderate atherosclerosis involving the thoracic and proximal
abdominal aorta. Atherosclerosis involving the proximal great
vessels without evidence of significant stenosis. Bovine aortic arch
anatomy (LEFT common carotid artery arises from the innominate
artery).

Mediastinum/Nodes: Numerous upper normal sized mediastinal and
BILATERAL hilar lymph nodes, the largest in the RIGHT hilum
measuring approximately 2.4 x 1.8 cm. No nodal masses. Normal
appearing esophagus. Visualized thyroid gland normal in appearance.

Lungs/Pleura: Ground-glass opacities throughout both lungs with
geographic areas of hyperlucency scattered throughout both lungs.
Scattered blebs or air cysts in both lungs. No confluent airspace
consolidation. Prominent subpleural fat involving both
posteroinferior hemithoraces and involving the LEFT major fissure.
No pleural effusions. Central airways patent with moderate central
peribronchial thickening.

Upper Abdomen: Visualized upper abdomen unremarkable for the early
arterial phase of enhancement.

Musculoskeletal: Degenerative disc disease and spondylosis involving
the visualized lower cervical spine and throughout the thoracic
spine. Prior sternotomy. No acute findings.

Review of the MIP images confirms the above findings.
IMPRESSION: 1. No evidence of pulmonary embolism.
2. Cardiomegaly with LEFT ventricular enlargement and LEFT atrial
enlargement. Reflux of contrast into the intrahepatic IVC and the
hepatic veins may indicate right heart failure and/or tricuspid
regurgitation.
3. Ground-glass opacities throughout both lungs with geographic
areas of hyperlucency scattered throughout both lungs, likely
indicating indicating small airways disease (asthma and/or
bronchitis). However, ground-glass opacities in both lungs is a
commonly reported imaging feature of 5Y1BZ-7E pneumonia. Other
processes such as influenza pneumonia and organizing pneumonia, as
can be seen with drug toxicity and connective tissue disease, can
cause a similar imaging pattern.
4. Numerous upper normal-sized mediastinal and BILATERAL hilar lymph
nodes, likely reactive.
5. Ascending thoracic aortic aneurysm measuring up to 5.0 cm
diameter. Recommend semi-annual imaging followup by CTA or MRA and
referral to cardiothoracic surgery if not already obtained. This
recommendation follows 3555
ACCF/AHA/AATS/ACR/ASA/SCA/UNI/ADENYO/TIP/DERWICH Guidelines for the
Diagnosis and Management of Patients With Thoracic Aortic Disease.
Circulation. 3555; 121: E266-e369. Aortic aneurysm NOS (GC95D-7BM.Q)

Aortic Atherosclerosis (GC95D-2H3.3).

Emphysema (GC95D-DR3.7).

## 2021-04-09 DIAGNOSIS — G4733 Obstructive sleep apnea (adult) (pediatric): Secondary | ICD-10-CM | POA: Diagnosis not present

## 2021-04-09 DIAGNOSIS — I10 Essential (primary) hypertension: Secondary | ICD-10-CM | POA: Diagnosis not present

## 2021-07-10 DIAGNOSIS — I110 Hypertensive heart disease with heart failure: Secondary | ICD-10-CM | POA: Diagnosis not present

## 2021-07-10 DIAGNOSIS — D500 Iron deficiency anemia secondary to blood loss (chronic): Secondary | ICD-10-CM | POA: Diagnosis not present

## 2021-07-10 DIAGNOSIS — G43009 Migraine without aura, not intractable, without status migrainosus: Secondary | ICD-10-CM | POA: Diagnosis not present

## 2021-07-10 DIAGNOSIS — M1990 Unspecified osteoarthritis, unspecified site: Secondary | ICD-10-CM | POA: Diagnosis not present

## 2021-07-10 DIAGNOSIS — E538 Deficiency of other specified B group vitamins: Secondary | ICD-10-CM | POA: Diagnosis not present

## 2021-07-10 DIAGNOSIS — Z23 Encounter for immunization: Secondary | ICD-10-CM | POA: Diagnosis not present

## 2021-07-10 DIAGNOSIS — I5022 Chronic systolic (congestive) heart failure: Secondary | ICD-10-CM | POA: Diagnosis not present

## 2021-07-10 DIAGNOSIS — E0590 Thyrotoxicosis, unspecified without thyrotoxic crisis or storm: Secondary | ICD-10-CM | POA: Diagnosis not present

## 2021-09-07 DIAGNOSIS — D500 Iron deficiency anemia secondary to blood loss (chronic): Secondary | ICD-10-CM | POA: Diagnosis not present

## 2021-09-07 DIAGNOSIS — E032 Hypothyroidism due to medicaments and other exogenous substances: Secondary | ICD-10-CM | POA: Diagnosis not present

## 2021-10-18 DIAGNOSIS — R030 Elevated blood-pressure reading, without diagnosis of hypertension: Secondary | ICD-10-CM | POA: Diagnosis not present

## 2021-10-18 DIAGNOSIS — J029 Acute pharyngitis, unspecified: Secondary | ICD-10-CM | POA: Diagnosis not present

## 2021-12-20 DIAGNOSIS — E032 Hypothyroidism due to medicaments and other exogenous substances: Secondary | ICD-10-CM | POA: Diagnosis not present

## 2022-01-16 DIAGNOSIS — E032 Hypothyroidism due to medicaments and other exogenous substances: Secondary | ICD-10-CM | POA: Diagnosis not present

## 2022-01-16 DIAGNOSIS — J449 Chronic obstructive pulmonary disease, unspecified: Secondary | ICD-10-CM | POA: Diagnosis not present

## 2022-01-16 DIAGNOSIS — Z86010 Personal history of colonic polyps: Secondary | ICD-10-CM | POA: Diagnosis not present

## 2022-01-16 DIAGNOSIS — Z0000 Encounter for general adult medical examination without abnormal findings: Secondary | ICD-10-CM | POA: Diagnosis not present

## 2022-01-16 DIAGNOSIS — Z125 Encounter for screening for malignant neoplasm of prostate: Secondary | ICD-10-CM | POA: Diagnosis not present

## 2022-01-16 DIAGNOSIS — I480 Paroxysmal atrial fibrillation: Secondary | ICD-10-CM | POA: Diagnosis not present

## 2022-01-16 DIAGNOSIS — E538 Deficiency of other specified B group vitamins: Secondary | ICD-10-CM | POA: Diagnosis not present

## 2022-01-16 DIAGNOSIS — K2270 Barrett's esophagus without dysplasia: Secondary | ICD-10-CM | POA: Diagnosis not present

## 2022-01-16 DIAGNOSIS — I5022 Chronic systolic (congestive) heart failure: Secondary | ICD-10-CM | POA: Diagnosis not present

## 2022-01-16 DIAGNOSIS — D500 Iron deficiency anemia secondary to blood loss (chronic): Secondary | ICD-10-CM | POA: Diagnosis not present

## 2022-01-16 DIAGNOSIS — Z1211 Encounter for screening for malignant neoplasm of colon: Secondary | ICD-10-CM | POA: Diagnosis not present

## 2022-01-16 DIAGNOSIS — E782 Mixed hyperlipidemia: Secondary | ICD-10-CM | POA: Diagnosis not present

## 2022-02-21 DIAGNOSIS — Z125 Encounter for screening for malignant neoplasm of prostate: Secondary | ICD-10-CM | POA: Diagnosis not present

## 2022-02-21 DIAGNOSIS — E782 Mixed hyperlipidemia: Secondary | ICD-10-CM | POA: Diagnosis not present

## 2022-02-21 DIAGNOSIS — D500 Iron deficiency anemia secondary to blood loss (chronic): Secondary | ICD-10-CM | POA: Diagnosis not present

## 2022-02-21 DIAGNOSIS — I5022 Chronic systolic (congestive) heart failure: Secondary | ICD-10-CM | POA: Diagnosis not present

## 2022-03-13 DIAGNOSIS — Z952 Presence of prosthetic heart valve: Secondary | ICD-10-CM | POA: Diagnosis not present

## 2022-03-13 DIAGNOSIS — R9431 Abnormal electrocardiogram [ECG] [EKG]: Secondary | ICD-10-CM | POA: Diagnosis not present

## 2022-03-13 DIAGNOSIS — I484 Atypical atrial flutter: Secondary | ICD-10-CM | POA: Diagnosis not present

## 2022-03-13 DIAGNOSIS — I480 Paroxysmal atrial fibrillation: Secondary | ICD-10-CM | POA: Diagnosis not present

## 2022-03-13 DIAGNOSIS — I4891 Unspecified atrial fibrillation: Secondary | ICD-10-CM | POA: Diagnosis not present

## 2022-03-13 DIAGNOSIS — J449 Chronic obstructive pulmonary disease, unspecified: Secondary | ICD-10-CM | POA: Diagnosis not present

## 2022-03-13 DIAGNOSIS — Z9889 Other specified postprocedural states: Secondary | ICD-10-CM | POA: Diagnosis not present

## 2022-03-13 DIAGNOSIS — K2270 Barrett's esophagus without dysplasia: Secondary | ICD-10-CM | POA: Diagnosis not present

## 2022-03-13 DIAGNOSIS — I10 Essential (primary) hypertension: Secondary | ICD-10-CM | POA: Diagnosis not present

## 2022-03-13 DIAGNOSIS — I440 Atrioventricular block, first degree: Secondary | ICD-10-CM | POA: Diagnosis not present

## 2022-04-09 DIAGNOSIS — F418 Other specified anxiety disorders: Secondary | ICD-10-CM | POA: Diagnosis not present

## 2022-04-09 DIAGNOSIS — M1711 Unilateral primary osteoarthritis, right knee: Secondary | ICD-10-CM | POA: Diagnosis not present

## 2022-05-18 ENCOUNTER — Encounter (HOSPITAL_COMMUNITY): Payer: Self-pay | Admitting: Emergency Medicine

## 2022-05-18 ENCOUNTER — Emergency Department (HOSPITAL_COMMUNITY)
Admission: EM | Admit: 2022-05-18 | Discharge: 2022-05-19 | Disposition: A | Discharge status: Home / Self Care | Payer: Medicaid Other | Attending: Emergency Medicine | Admitting: Emergency Medicine

## 2022-05-18 ENCOUNTER — Other Ambulatory Visit: Payer: Self-pay

## 2022-05-18 ENCOUNTER — Emergency Department (HOSPITAL_COMMUNITY): Payer: Medicaid Other

## 2022-05-18 DIAGNOSIS — I509 Heart failure, unspecified: Secondary | ICD-10-CM

## 2022-05-18 DIAGNOSIS — I110 Hypertensive heart disease with heart failure: Secondary | ICD-10-CM | POA: Diagnosis not present

## 2022-05-18 DIAGNOSIS — J811 Chronic pulmonary edema: Secondary | ICD-10-CM | POA: Diagnosis not present

## 2022-05-18 DIAGNOSIS — I480 Paroxysmal atrial fibrillation: Secondary | ICD-10-CM | POA: Insufficient documentation

## 2022-05-18 DIAGNOSIS — J449 Chronic obstructive pulmonary disease, unspecified: Secondary | ICD-10-CM | POA: Insufficient documentation

## 2022-05-18 DIAGNOSIS — Z7901 Long term (current) use of anticoagulants: Secondary | ICD-10-CM | POA: Insufficient documentation

## 2022-05-18 DIAGNOSIS — R7989 Other specified abnormal findings of blood chemistry: Secondary | ICD-10-CM | POA: Diagnosis not present

## 2022-05-18 DIAGNOSIS — I5022 Chronic systolic (congestive) heart failure: Secondary | ICD-10-CM | POA: Diagnosis not present

## 2022-05-18 DIAGNOSIS — Z7982 Long term (current) use of aspirin: Secondary | ICD-10-CM | POA: Diagnosis not present

## 2022-05-18 DIAGNOSIS — Z20822 Contact with and (suspected) exposure to covid-19: Secondary | ICD-10-CM | POA: Insufficient documentation

## 2022-05-18 DIAGNOSIS — R0602 Shortness of breath: Secondary | ICD-10-CM | POA: Diagnosis not present

## 2022-05-18 DIAGNOSIS — Z79899 Other long term (current) drug therapy: Secondary | ICD-10-CM | POA: Insufficient documentation

## 2022-05-18 LAB — BASIC METABOLIC PANEL
Anion gap: 9 (ref 5–15)
BUN: 11 mg/dL (ref 8–23)
CO2: 25 mmol/L (ref 22–32)
Calcium: 8.9 mg/dL (ref 8.9–10.3)
Chloride: 103 mmol/L (ref 98–111)
Creatinine, Ser: 0.79 mg/dL (ref 0.61–1.24)
GFR, Estimated: >60 mL/min (ref >60)
Glucose, Bld: 118 mg/dL — ABNORMAL HIGH (ref 70–99)
Potassium: 4.1 mmol/L (ref 3.5–5.1)
Sodium: 137 mmol/L (ref 135–145)

## 2022-05-18 LAB — CBC
HCT: 39.6 % (ref 39.0–52.0)
Hemoglobin: 12.8 g/dL — ABNORMAL LOW (ref 13.0–17.0)
MCH: 29.1 pg (ref 26.0–34.0)
MCHC: 32.3 g/dL (ref 30.0–36.0)
MCV: 90 fL (ref 80.0–100.0)
Platelets: 134 10*3/uL — ABNORMAL LOW (ref 150–400)
RBC: 4.4 MIL/uL (ref 4.22–5.81)
RDW: 14.4 % (ref 11.5–15.5)
WBC: 8.9 10*3/uL (ref 4.0–10.5)
nRBC: 0 % (ref 0.0–0.2)

## 2022-05-18 LAB — TROPONIN I (HIGH SENSITIVITY): Troponin I (High Sensitivity): 30 ng/L — ABNORMAL HIGH (ref <18)

## 2022-05-18 LAB — BRAIN NATRIURETIC PEPTIDE: B Natriuretic Peptide: 268.9 pg/mL — ABNORMAL HIGH (ref 0.0–100.0)

## 2022-05-18 NOTE — ED Provider Triage Note (Signed)
Emergency Medicine Provider Triage Evaluation Note  Kunaal Walkins , a 71 y.o. male  was evaluated in triage.  Pt complains of shortness of breath, chest pain,, fevers.  States this has been present for the past 3 days.  Has cardiac history.  States it feels similar to when he had cardiac events in the past.  Review of Systems  Positive: As above Negative:   Physical Exam  BP (!) 168/97 (BP Location: Right Arm)   Pulse 88   Temp 98.5 F (36.9 C) (Oral)   Resp (!) 22   SpO2 95%  Gen:   Awake, no distress   Resp:  Normal effort  MSK:   Moves extremities without difficulty  Other:  Lungs CTA B  Medical Decision Making  Medically screening exam initiated at 8:37 PM.  Appropriate orders placed.  Mose Colaizzi was informed that the remainder of the evaluation will be completed by another provider, this initial triage assessment does not replace that evaluation, and the importance of remaining in the ED until their evaluation is complete.  Patient is overall well-appearing.  Chest pain work-up initiated.   Roylene Reason, [place] 05/18/22 2044

## 2022-05-18 NOTE — ED Triage Notes (Signed)
Pt reported to ED with c/o shortness of breath, states that he has some "rattling in his chest" that has been ongoing for approximately 3 days. Also endorses nausea and vomiting, states that he took a Covid test yesterday that was negative.

## 2022-05-19 LAB — RESP PANEL BY RT-PCR (FLU A&B, COVID) ARPGX2
Influenza A by PCR: NEGATIVE
Influenza B by PCR: NEGATIVE
SARS Coronavirus 2 by RT PCR: NEGATIVE

## 2022-05-19 LAB — TROPONIN I (HIGH SENSITIVITY)
Troponin I (High Sensitivity): 39 ng/L — ABNORMAL HIGH (ref <18)
Troponin I (High Sensitivity): 42 ng/L — ABNORMAL HIGH (ref <18)

## 2022-05-19 MED ORDER — APIXABAN 5 MG PO TABS
5.0000 mg | ORAL_TABLET | Freq: Two times a day (BID) | ORAL | Status: DC
  Administered 2022-05-19: 5 mg via ORAL
  Filled 2022-05-19: qty 1

## 2022-05-19 MED ORDER — AMIODARONE HCL 200 MG PO TABS
200.0000 mg | ORAL_TABLET | Freq: Every day | ORAL | Status: DC
  Administered 2022-05-19: 200 mg via ORAL
  Filled 2022-05-19: qty 1

## 2022-05-19 MED ORDER — HYDROCOD POLI-CHLORPHE POLI ER 10-8 MG/5ML PO SUER
5.0000 mL | Freq: Once | ORAL | Status: AC
  Administered 2022-05-19: 5 mL via ORAL
  Filled 2022-05-19: qty 5

## 2022-05-19 MED ORDER — CARVEDILOL 3.125 MG PO TABS: 6.2500 mg | ORAL_TABLET | Freq: Two times a day (BID) | ORAL | Status: DC

## 2022-05-19 MED ORDER — FUROSEMIDE 10 MG/ML IJ SOLN
40.0000 mg | Freq: Once | INTRAMUSCULAR | Status: AC
  Administered 2022-05-19: 40 mg via INTRAVENOUS
  Filled 2022-05-19: qty 4

## 2022-05-19 NOTE — Discharge Instructions (Signed)
Follow-up with your cardiologist as discussed.  Please take the copies of your EKGs with you.  Continue with your Lasix as prescribed.  Return to the ER for worsening or concerning symptoms.

## 2022-05-19 NOTE — ED Provider Notes (Signed)
[facility] EMERGENCY DEPARTMENT Provider Note   CSN: [id number] Arrival date & time: 05/18/22  2004     History  Chief Complaint  Patient presents with   Shortness of Breath    Craig Kirk is a 71 y.o. male.  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 Presents with complaint of SHOB with sudden onset on Thursday of Friday (2-3 days ago), constant, associated with burning in the chest and non productive cough, 1 episode of nausea/vomiting. Denies fevers, chills, sick contacts, lower extremity swelling. Unable to say if symptoms are position or exertional. Is somewhat of a difficult historian, is frustrated after a long wait in the ER lobby all night.  Here with family member who reports patient called him last night, said he was short of breath and needed to get to the ER.  Former smoker.  Did not take PM meds last night due to vomiting and coming to the ER, has not had AM meds due to waiting in the ER lobby.        Home Medications Prior to Admission medications   Medication Sig Start Date End Date Taking? Authorizing Provider  acetaminophen (TYLENOL) 500 MG tablet Take 500 mg by mouth daily as needed (pain).   Yes [provider]  amiodarone (PACERONE) 200 MG tablet Take 100 mg by mouth daily.   Yes [provider]  Ascorbic Acid (VITAMIN C PO) Take 1 tablet by mouth daily.   Yes [provider]  aspirin EC 81 MG tablet Take 81 mg by mouth daily.   Yes [provider]  Carboxymethylcellulose Sodium (EYE DROPS OP) Place 1 drop into both eyes daily as needed (redness).   Yes  [provider]  carvedilol (COREG) 6.25 MG tablet Take 9.375 mg by mouth 2 (two) times daily. 04/30/22  Yes [provider]  Cyanocobalamin (VITAMIN B-12 PO) Take 1 capsule by mouth daily.   Yes [provider]  ELIQUIS 5 MG TABS tablet Take 10 mg by mouth 2 (two) times daily. 04/26/22  Yes [provider]  Ferrous Sulfate (IRON PO) Take 1 tablet by mouth daily.   Yes [provider]  furosemide (LASIX) 40 MG tablet Take 40 mg by mouth every other day.   Yes [provider]  ibuprofen (ADVIL) 200 MG tablet Take 200 mg by mouth as needed (pain).   Yes [provider]  levothyroxine (SYNTHROID) 75 MCG tablet Take 75 mcg by mouth every morning. 02/22/22  Yes [provider]  lisinopril (PRINIVIL,ZESTRIL) 20 MG tablet Take 20 mg by mouth daily.   Yes [provider]  potassium chloride (KLOR-CON) 20 MEQ packet Take 20 mEq by mouth daily.   Yes [provider]  pravastatin (PRAVACHOL) 40 MG tablet Take 40 mg by mouth daily. 08/06/19  Yes [provider]  sertraline (ZOLOFT) 50 MG tablet Take 150 mg by mouth daily. 05/02/22  Yes [provider]  diltiazem (CARDIZEM CD) 180 MG 24 hr capsule Take 180 mg by mouth daily. Patient not taking: Reported on 05/19/2022 05/26/19   [provider]  hydrochlorothiazide (MICROZIDE) 12.5 MG capsule Take 12.5  mg by mouth daily.   Patient not taking: Reported on 05/19/2022    [provider]  rizatriptan (MAXALT) 10 MG tablet Take 10 mg by mouth 2 (two) times daily as needed. Patient not taking: Reported on 05/19/2022 08/16/19   [provider]  sotalol (BETAPACE) 80 MG tablet Take 160 mg by mouth 2 (two) times daily. May have taken twice this am 07/24/11 at 7:20am  Patient not taking: Reported on 05/19/2022    [provider]  warfarin (COUMADIN) 2.5 MG tablet Take 2.5 mg by mouth daily. Takes tues, thurs., sat.  Patient not taking:  Reported on 05/19/2022    [provider]  warfarin (COUMADIN) 5 MG tablet Take 5 mg by mouth daily. 07/24/11 may have taken warfarin twice this am..pt. Not sure.  Takes 59m mon., wed., fri., sun.  Patient not taking: Reported on 05/19/2022    [provider]      Allergies    Zonisamide    Review of Systems   Review of Systems Negative except as per HPI Physical Exam Updated Vital Signs BP 105/87   Pulse (!) 104   Temp 97.9 F (36.6 C) (Oral)   Resp (!) 21   SpO2 94%  Physical Exam Vitals and nursing note reviewed.  Constitutional:      General: He is not in acute distress.    Appearance: He is well-developed. He is not diaphoretic.  HENT:     Head: Normocephalic and atraumatic.  Cardiovascular:     Rate and Rhythm: Tachycardia present. Rhythm irregular.     Pulses: Normal pulses.  Pulmonary:     Effort: Pulmonary effort is normal.     Breath sounds: Examination of the right-lower field reveals rhonchi. Examination of the left-lower field reveals rhonchi. Rhonchi present.  Abdominal:     Palpations: Abdomen is soft.     Tenderness: There is no abdominal tenderness.  Musculoskeletal:     Cervical back: Neck supple.     Right lower leg: No tenderness. No edema.     Left lower leg: No tenderness. No edema.  Skin:    General: Skin is warm and dry.  Neurological:     Mental Status: He is alert and oriented to person, place, and time.  Psychiatric:        Behavior: Behavior normal.     ED Results / Procedures / Treatments   Labs (all labs ordered are listed, but only abnormal results are displayed) Labs Reviewed  BASIC METABOLIC PANEL - Abnormal; Notable for the following components:      Result Value   Glucose, Bld 118 (*)    All other components within normal limits  CBC - Abnormal; Notable for the following components:   Hemoglobin 12.8 (*)    Platelets 134 (*)    All other components within normal limits  BRAIN NATRIURETIC PEPTIDE - Abnormal;  Notable for the following components:   B Natriuretic Peptide 268.9 (*)    All other components within normal limits  TROPONIN I (HIGH SENSITIVITY) - Abnormal; Notable for the following components:   Troponin I (High Sensitivity) 30 (*)    All other components within normal limits  TROPONIN I (HIGH SENSITIVITY) - Abnormal; Notable for the following components:   Troponin I (High Sensitivity) 42 (*)    All other components within normal limits  TROPONIN I (HIGH SENSITIVITY) - Abnormal; Notable for the following components:   Troponin I (High Sensitivity) 39 (*)    All other components  within normal limits  RESP PANEL BY RT-PCR (FLU A&B, COVID) [id number]    EKG EKG Interpretation  Date/Time:  Sunday May 19 2022 07:33:54 EDT Ventricular Rate:  76 PR Interval:    QRS Duration: 122 QT Interval:  462 QTC Calculation: 520 R Axis:   -1 Text Interpretation: Atrial fibrillation LVH with secondary repolarization abnormality Prolonged QT interval much better baseline Confirmed by Isla Pence [phone]) on 05/19/2022 7:40:07 AM  Radiology DG Chest 2 View  Result Date: 05/18/2022 CLINICAL DATA:  Shortness of breath. EXAM: CHEST - 2 VIEW COMPARISON:  Chest radiograph dated 09/07/2020. FINDINGS: Heart is mildly enlarged. Vascular calcifications are seen in the aortic arch. There is mild pulmonary vascular congestion. There is no pleural effusion or pneumothorax. Degenerative changes are seen in the spine. Median sternotomy wires are noted. IMPRESSION: Cardiomegaly and pulmonary vascular congestion. Aortic Atherosclerosis (ICD10-I70.0). Electronically Signed   By: Zerita Boers M.D.   On: 05/18/2022 21:00    Procedures Procedures    Medications Ordered in ED Medications  carvedilol (COREG) tablet 6.25 mg (has no administration in time range)  amiodarone (PACERONE) tablet 200 mg (200 mg Oral Given 05/19/22 1053)  apixaban (ELIQUIS) tablet 5 mg (5 mg Oral Given 05/19/22 1052)  furosemide  (LASIX) injection 40 mg (40 mg Intravenous Given 05/19/22 0808)  chlorpheniramine-HYDROcodone (TUSSIONEX) 10-8 MG/5ML suspension 5 mL (5 mLs Oral Given 05/19/22 [id number])    ED Course/ Medical Decision Making/ A&P                           Medical Decision Making Risk Prescription drug management.   This patient presents to the ED for concern of SHOB, chest burning, this involves an extensive number of treatment options, and is a complaint that carries with it a high risk of complications and morbidity.  The differential diagnosis includes but not limited to CHF, ACS, arrhythmia, PNA, COVID/viral illness   Co morbidities that complicate the patient evaluation  As per HPI   Additional history obtained:  Additional history obtained from family member/friend at bedside who contributes as above External records from outside source obtained and reviewed including note from cardiology visit 03/13/22 with plan for Zio patch, continue on amiodarone, follow up in clinic in 6 months. Echo 02/21/22 with chronic systolic CHF with EF [id number]    Lab Tests:  I Ordered, and personally interpreted labs.  The pertinent results include: CBC without significant findings, BMP without seeming findings.  COVID and flu negative.  BNP elevated to 68.9. 30/42/39 troponin trend   Imaging Studies ordered:  I ordered imaging studies including CXR  I independently visualized and interpreted imaging which showed vascular congestion  I agree with the radiologist interpretation   Cardiac Monitoring: / EKG:  The patient was maintained on a cardiac monitor.  I personally viewed and interpreted the cardiac monitored which showed an underlying rhythm of: a fib rate 76   Consultations Obtained:  I requested consultation with the Er attending, Dr. Gilford Raid,  and discussed lab and imaging findings as well as pertinent plan - they recommend: has seen the patient, recommends lasix, recheck, if trop without significant  increase, can likely DC   Problem List / ED Course / Critical interventions / Medication management  71 year old male presents with complaint of shortness of breath for the past few days.  He is a difficult historian but notes that he has had a nonproductive cough, no fevers or sick symptoms.  He does  not have any lower extremity swelling.  No lower extreme edema, does sound wet on exam, chest x-ray with vascular congestion, elevated BNP, suspect CHF without supplemental oxygen requirement.  Troponins trended without significant change.  EKG, found to be in A-fib, known paroxysmal A-fib, is on Eliquis, is rate controlled.  Patient was given IV Lasix with improvement in symptoms and requesting discharge.  Advised to continue his Lasix at home and follow-up with his cardiology team this week. I ordered medication including Lasix for CHF Reevaluation of the patient after these medicines showed that the patient improved I have reviewed the patients home medicines and have made adjustments as needed   Social Determinants of Health:  Lives with family, has PCP and cardiology teams for followup   Test / Admission - Considered:  Admission considered however patient with improved condition after IV Lasix, significant urine output, no oxygen requirement.  Patient is felt safe for discharge to follow-up with his cardiologist this week.  He is provided with a copy of his EKG today.  Given his Eliquis and morning meds prior to discharge.         Final Clinical Impression(s) / ED Diagnoses Final diagnoses:  Acute on chronic congestive heart failure, unspecified heart failure type ([place])  Paroxysmal atrial fibrillation [facility])    Rx / DC Orders ED Discharge Orders     None         Tacy Learn, PA-C 05/19/22 1406    Isla Pence, MD 05/19/22 1501

## 2022-07-10 DIAGNOSIS — I4891 Unspecified atrial fibrillation: Secondary | ICD-10-CM | POA: Diagnosis not present

## 2022-12-09 DIAGNOSIS — I480 Paroxysmal atrial fibrillation: Secondary | ICD-10-CM | POA: Diagnosis not present

## 2022-12-09 DIAGNOSIS — Z952 Presence of prosthetic heart valve: Secondary | ICD-10-CM | POA: Diagnosis not present

## 2023-07-01 DIAGNOSIS — L02212 Cutaneous abscess of back [any part, except buttock]: Secondary | ICD-10-CM | POA: Diagnosis not present

## 2023-07-03 DIAGNOSIS — Z5189 Encounter for other specified aftercare: Secondary | ICD-10-CM | POA: Diagnosis not present

## 2023-07-07 DIAGNOSIS — Z952 Presence of prosthetic heart valve: Secondary | ICD-10-CM | POA: Diagnosis not present

## 2023-07-07 DIAGNOSIS — I480 Paroxysmal atrial fibrillation: Secondary | ICD-10-CM | POA: Diagnosis not present

## 2023-07-07 DIAGNOSIS — I10 Essential (primary) hypertension: Secondary | ICD-10-CM | POA: Diagnosis not present

## 2023-07-08 DIAGNOSIS — L02212 Cutaneous abscess of back [any part, except buttock]: Secondary | ICD-10-CM | POA: Diagnosis not present

## 2023-10-29 DIAGNOSIS — I4892 Unspecified atrial flutter: Secondary | ICD-10-CM | POA: Diagnosis not present

## 2023-10-29 DIAGNOSIS — J439 Emphysema, unspecified: Secondary | ICD-10-CM | POA: Diagnosis not present

## 2023-10-29 DIAGNOSIS — I110 Hypertensive heart disease with heart failure: Secondary | ICD-10-CM | POA: Diagnosis not present

## 2023-10-29 DIAGNOSIS — T82857A Stenosis of cardiac prosthetic devices, implants and grafts, initial encounter: Secondary | ICD-10-CM | POA: Diagnosis not present

## 2023-10-29 DIAGNOSIS — Z7982 Long term (current) use of aspirin: Secondary | ICD-10-CM | POA: Diagnosis not present

## 2023-10-29 DIAGNOSIS — I5022 Chronic systolic (congestive) heart failure: Secondary | ICD-10-CM | POA: Diagnosis not present

## 2023-10-29 DIAGNOSIS — Z79899 Other long term (current) drug therapy: Secondary | ICD-10-CM | POA: Diagnosis not present
